# Patient Record
Sex: Female | Born: 1960 | Race: White | Hispanic: No | Marital: Married | Smoking: Never smoker
Health system: Southern US, Community
[De-identification: ages and names within clinical notes are randomized; demographics above are authoritative.]

## PROBLEM LIST (undated history)

## (undated) DIAGNOSIS — M1990 Unspecified osteoarthritis, unspecified site: Secondary | ICD-10-CM

## (undated) DIAGNOSIS — G43909 Migraine, unspecified, not intractable, without status migrainosus: Secondary | ICD-10-CM

## (undated) HISTORY — PX: COLONOSCOPY: SHX174

## (undated) HISTORY — DX: Migraine, unspecified, not intractable, without status migrainosus: G43.909

## (undated) HISTORY — DX: Unspecified osteoarthritis, unspecified site: M19.90

---

## 2001-05-09 ENCOUNTER — Encounter: Payer: Self-pay | Admitting: Specialist

## 2001-05-09 ENCOUNTER — Ambulatory Visit (HOSPITAL_COMMUNITY): Admission: RE | Admit: 2001-05-09 | Discharge: 2001-05-09 | Payer: Self-pay | Admitting: Specialist

## 2002-11-18 ENCOUNTER — Encounter: Payer: Self-pay | Admitting: Radiology

## 2002-11-18 ENCOUNTER — Encounter: Payer: Self-pay | Admitting: Neurosurgery

## 2002-11-18 ENCOUNTER — Encounter: Admission: RE | Admit: 2002-11-18 | Discharge: 2002-11-18 | Payer: Self-pay | Admitting: Neurosurgery

## 2002-12-03 ENCOUNTER — Encounter: Admission: RE | Admit: 2002-12-03 | Discharge: 2002-12-03 | Payer: Self-pay | Admitting: Neurosurgery

## 2005-04-14 ENCOUNTER — Ambulatory Visit (HOSPITAL_COMMUNITY): Admission: RE | Admit: 2005-04-14 | Discharge: 2005-04-14 | Payer: Self-pay | Admitting: Unknown Physician Specialty

## 2006-05-15 ENCOUNTER — Ambulatory Visit (HOSPITAL_COMMUNITY): Admission: RE | Admit: 2006-05-15 | Discharge: 2006-05-15 | Payer: Self-pay | Admitting: Specialist

## 2007-08-07 ENCOUNTER — Ambulatory Visit (HOSPITAL_COMMUNITY): Admission: RE | Admit: 2007-08-07 | Discharge: 2007-08-07 | Payer: Self-pay | Admitting: Obstetrics & Gynecology

## 2007-08-07 ENCOUNTER — Other Ambulatory Visit: Admission: RE | Admit: 2007-08-07 | Discharge: 2007-08-07 | Payer: Self-pay | Admitting: Obstetrics & Gynecology

## 2012-05-28 ENCOUNTER — Other Ambulatory Visit: Payer: Self-pay | Admitting: Obstetrics & Gynecology

## 2012-05-28 DIAGNOSIS — Z139 Encounter for screening, unspecified: Secondary | ICD-10-CM

## 2012-05-30 ENCOUNTER — Ambulatory Visit (HOSPITAL_COMMUNITY)
Admission: RE | Admit: 2012-05-30 | Discharge: 2012-05-30 | Disposition: A | Discharge status: Home / Self Care | Payer: BC Managed Care – PPO | Source: Ambulatory Visit | Attending: Obstetrics & Gynecology | Admitting: Obstetrics & Gynecology

## 2012-05-30 DIAGNOSIS — Z139 Encounter for screening, unspecified: Secondary | ICD-10-CM

## 2012-05-30 DIAGNOSIS — Z1231 Encounter for screening mammogram for malignant neoplasm of breast: Secondary | ICD-10-CM | POA: Insufficient documentation

## 2012-05-31 ENCOUNTER — Other Ambulatory Visit: Payer: Self-pay | Admitting: Obstetrics & Gynecology

## 2012-05-31 DIAGNOSIS — R928 Other abnormal and inconclusive findings on diagnostic imaging of breast: Secondary | ICD-10-CM

## 2012-06-07 ENCOUNTER — Telehealth: Payer: Self-pay | Admitting: Obstetrics & Gynecology

## 2012-06-07 NOTE — Telephone Encounter (Signed)
Spoke with pt. Pt was wanting to know if Dr. Despina Hidden wanted pt to have a MRI  Of right breast or diagnostic mammogram. Pt can have MRI of breast covered at 100% at [facility]. What do you advise?

## 2012-06-07 NOTE — Telephone Encounter (Signed)
Informed pt that Dr. Despina Hidden recommends a diagnostic mammogram initially then MRI if needed. Pt voiced understanding.

## 2012-06-12 ENCOUNTER — Ambulatory Visit (HOSPITAL_COMMUNITY)
Admission: RE | Admit: 2012-06-12 | Discharge: 2012-06-12 | Disposition: A | Discharge status: Home / Self Care | Payer: BC Managed Care – PPO | Source: Ambulatory Visit | Attending: Obstetrics & Gynecology | Admitting: Obstetrics & Gynecology

## 2012-06-12 DIAGNOSIS — R928 Other abnormal and inconclusive findings on diagnostic imaging of breast: Secondary | ICD-10-CM | POA: Insufficient documentation

## 2012-07-02 ENCOUNTER — Ambulatory Visit (INDEPENDENT_AMBULATORY_CARE_PROVIDER_SITE_OTHER): Payer: BC Managed Care – PPO | Admitting: Obstetrics & Gynecology

## 2012-07-02 ENCOUNTER — Encounter: Payer: Self-pay | Admitting: Obstetrics & Gynecology

## 2012-07-02 VITALS — BP 110/70 | Ht 62.0 in | Wt 126.0 lb

## 2012-07-02 DIAGNOSIS — Z01419 Encounter for gynecological examination (general) (routine) without abnormal findings: Secondary | ICD-10-CM

## 2012-07-02 NOTE — Progress Notes (Signed)
Patient ID: Kylie Miles, female   DOB: 1960-06-29, 52 y.o.   MRN: [id number] Subjective:     Kylie Miles is a 52 y.o. female here for a routine exam.  Patient's last menstrual period was 06/18/2012. No obstetric history on file. Current complaints: none.  Personal health questionnaire reviewed: no.   Gynecologic History Patient's last menstrual period was 06/18/2012. Contraception: vasectomy Last Pap: 2013  Results were: normal Last mammogram: 2014. Results were: normal  Obstetric History OB History   Grav Para Term Preterm Abortions TAB SAB Ect Mult Living                   The following portions of the patient's history were reviewed and updated as appropriate: allergies, current medications, past family history, past medical history, past social history, past surgical history and problem list.  Review of Systems  Review of Systems  Constitutional: Negative for fever, chills, weight loss, malaise/fatigue and diaphoresis.  HENT: Negative for hearing loss, ear pain, nosebleeds, congestion, sore throat, neck pain, tinnitus and ear discharge.   Eyes: Negative for blurred vision, double vision, photophobia, pain, discharge and redness.  Respiratory: Negative for cough, hemoptysis, sputum production, shortness of breath, wheezing and stridor.   Cardiovascular: Negative for chest pain, palpitations, orthopnea, claudication, leg swelling and PND.  Gastrointestinal: negative for abdominal pain. Negative for heartburn, nausea, vomiting, diarrhea, constipation, blood in stool and melena.  Genitourinary: Negative for dysuria, urgency, frequency, hematuria and flank pain.  Musculoskeletal: Negative for myalgias, back pain, joint pain and falls.  Skin: Negative for itching and rash.  Neurological: Negative for dizziness, tingling, tremors, sensory change, speech change, focal weakness, seizures, loss of consciousness, weakness and headaches.  Endo/Heme/Allergies: Negative for  environmental allergies and polydipsia. Does not bruise/bleed easily.  Psychiatric/Behavioral: Negative for depression, suicidal ideas, hallucinations, memory loss and substance abuse. The patient is not nervous/anxious and does not have insomnia.        Objective:    Physical Exam  Vitals reviewed. Constitutional: She is oriented to person, place, and time. She appears well-developed and well-nourished.  HENT:  Head: Normocephalic and atraumatic.        Right Ear: External ear normal.  Left Ear: External ear normal.  Nose: Nose normal.  Mouth/Throat: Oropharynx is clear and moist.  Eyes: Conjunctivae and EOM are normal. Pupils are equal, round, and reactive to light. Right eye exhibits no discharge. Left eye exhibits no discharge. No scleral icterus.  Neck: Normal range of motion. Neck supple. No tracheal deviation present. No thyromegaly present.  Cardiovascular: Normal rate, regular rhythm, normal heart sounds and intact distal pulses.  Exam reveals no gallop and no friction rub.   No murmur heard. Respiratory: Effort normal and breath sounds normal. No respiratory distress. She has no wheezes. She has no rales. She exhibits no tenderness.  GI: Soft. Bowel sounds are normal. She exhibits no distension and no mass. There is no tenderness. There is no rebound and no guarding.  Genitourinary:       Vulva is normal without lesions Vagina is pink moist without discharge Cervix normal in appearance and pap is done Uterus is normal size shape and contour Adnexa is negative with normal sized ovaries   Musculoskeletal: Normal range of motion. She exhibits no edema and no tenderness.  Neurological: She is alert and oriented to person, place, and time. She has normal reflexes. She displays normal reflexes. No cranial nerve deficit. She exhibits normal muscle tone. Coordination normal.  Skin: Skin  is warm and dry. No rash noted. No erythema. No pallor.  Psychiatric: She has a normal mood and  affect. Her behavior is normal. Judgment and thought content normal.       Assessment:    Healthy female exam.    Plan:    Follow up in: 1 year.

## 2013-08-04 ENCOUNTER — Other Ambulatory Visit: Payer: Self-pay | Admitting: Obstetrics & Gynecology

## 2013-08-04 DIAGNOSIS — Z1231 Encounter for screening mammogram for malignant neoplasm of breast: Secondary | ICD-10-CM

## 2013-08-11 ENCOUNTER — Ambulatory Visit (HOSPITAL_COMMUNITY)
Admission: RE | Admit: 2013-08-11 | Discharge: 2013-08-11 | Disposition: A | Discharge status: Home / Self Care | Payer: BC Managed Care – PPO | Source: Ambulatory Visit | Attending: Obstetrics & Gynecology | Admitting: Obstetrics & Gynecology

## 2013-08-11 DIAGNOSIS — Z1231 Encounter for screening mammogram for malignant neoplasm of breast: Secondary | ICD-10-CM

## 2013-08-14 ENCOUNTER — Ambulatory Visit (INDEPENDENT_AMBULATORY_CARE_PROVIDER_SITE_OTHER): Payer: BC Managed Care – PPO | Admitting: Obstetrics & Gynecology

## 2013-08-14 ENCOUNTER — Other Ambulatory Visit (HOSPITAL_COMMUNITY)
Admission: RE | Admit: 2013-08-14 | Discharge: 2013-08-14 | Disposition: A | Discharge status: Home / Self Care | Payer: BC Managed Care – PPO | Source: Ambulatory Visit | Attending: Obstetrics & Gynecology | Admitting: Obstetrics & Gynecology

## 2013-08-14 ENCOUNTER — Encounter: Payer: Self-pay | Admitting: Obstetrics & Gynecology

## 2013-08-14 VITALS — BP 120/80 | Ht 62.0 in | Wt 131.4 lb

## 2013-08-14 DIAGNOSIS — Z01419 Encounter for gynecological examination (general) (routine) without abnormal findings: Secondary | ICD-10-CM | POA: Insufficient documentation

## 2013-08-14 DIAGNOSIS — Z1151 Encounter for screening for human papillomavirus (HPV): Secondary | ICD-10-CM | POA: Insufficient documentation

## 2013-08-14 DIAGNOSIS — Z1212 Encounter for screening for malignant neoplasm of rectum: Secondary | ICD-10-CM

## 2013-08-14 NOTE — Progress Notes (Signed)
Patient ID: Kylie SongsterConnie H Wieand, female   DOB: 05/11/1960, 53 y.o.   MRN: [id number] Subjective:     Kylie Miles is a 53 y.o. female here for a routine exam. Having periods every other month. No obstetric history on file. Birth Control Method:  vasectomy Menstrual Calendar(currently): every other month 4 days  Current complaints: insomnia.   Current acute medical issues:  migraines   Recent Gynecologic History No LMP recorded. Patient is not currently having periods (Reason: Perimenopausal). Last Pap: 2014,  normal Last mammogram: 08/12/2013,  normal  Past Medical History  Diagnosis Date  . Degenerative joint disease   . Migraine     Past Surgical History  Procedure Laterality Date  . Cesarean section    . Colonoscopy      OB History   Grav Para Term Preterm Abortions TAB SAB Ect Mult Living                  History   Social History  . Marital Status: Married    Spouse Name: N/A    Number of Children: N/A  . Years of Education: N/A   Social History Main Topics  . Smoking status: Never Smoker   . Smokeless tobacco: None  . Alcohol Use: None  . Drug Use: None  . Sexual Activity: Yes   Other Topics Concern  . None   Social History Narrative  . None    Family History  Problem Relation Age of Onset  . Cancer Mother     brain tumor  . Arthritis Mother     degenative joint disease  . Breast cancer Mother   . Pancreatic cancer Father      Review of Systems  Review of Systems  Constitutional: Negative for fever, chills, weight loss, malaise/fatigue and diaphoresis.  HENT: Negative for hearing loss, ear pain, nosebleeds, congestion, sore throat, neck pain, tinnitus and ear discharge.   Eyes: Negative for blurred vision, double vision, photophobia, pain, discharge and redness.  Respiratory: Negative for cough, hemoptysis, sputum production, shortness of breath, wheezing and stridor.   Cardiovascular: Negative for chest pain, palpitations, orthopnea,  claudication, leg swelling and PND.  Gastrointestinal: negative for abdominal pain. Negative for heartburn, nausea, vomiting, diarrhea, constipation, blood in stool and melena.  Genitourinary: Negative for dysuria, urgency, frequency, hematuria and flank pain.  Musculoskeletal: Negative for myalgias, back pain, joint pain and falls.  Skin: Negative for itching and rash.  Neurological: Negative for dizziness, tingling, tremors, sensory change, speech change, focal weakness, seizures, loss of consciousness, weakness and headaches.  Endo/Heme/Allergies: Negative for environmental allergies and polydipsia. Does not bruise/bleed easily.  Psychiatric/Behavioral: Negative for depression, suicidal ideas, hallucinations, memory loss and substance abuse. The patient is not nervous/anxious and does not have insomnia.        Objective:    Physical Exam  Vitals reviewed. Constitutional: She is oriented to person, place, and time. She appears well-developed and well-nourished.  HENT:  Head: Normocephalic and atraumatic.        Right Ear: External ear normal.  Left Ear: External ear normal.  Nose: Nose normal.  Mouth/Throat: Oropharynx is clear and moist.  Eyes: Conjunctivae and EOM are normal. Pupils are equal, round, and reactive to light. Right eye exhibits no discharge. Left eye exhibits no discharge. No scleral icterus.  Neck: Normal range of motion. Neck supple. No tracheal deviation present. No thyromegaly present.  Cardiovascular: Normal rate, regular rhythm, normal heart sounds and intact distal pulses.  Exam reveals no gallop  and no friction rub.   No murmur heard. Respiratory: Effort normal and breath sounds normal. No respiratory distress. She has no wheezes. She has no rales. She exhibits no tenderness.  GI: Soft. Bowel sounds are normal. She exhibits no distension and no mass. There is no tenderness. There is no rebound and no guarding.  Genitourinary:  Breasts no masses skin changes or  nipple changes bilaterally      Vulva is normal without lesions Vagina is pink moist without discharge Cervix normal in appearance and pap is done Uterus is normal size shape and contour Adnexa is negative with normal sized ovaries  Rectal    hemoccult negative, normal tone, no masses  Musculoskeletal: Normal range of motion. She exhibits no edema and no tenderness.  Neurological: She is alert and oriented to person, place, and time. She has normal reflexes. She displays normal reflexes. No cranial nerve deficit. She exhibits normal muscle tone. Coordination normal.  Skin: Skin is warm and dry. No rash noted. No erythema. No pallor.  Psychiatric: She has a normal mood and affect. Her behavior is normal. Judgment and thought content normal.       Assessment:    Healthy female exam.    Plan:    Follow up in: 1 year. melatonin is recommended

## 2013-08-14 NOTE — Addendum Note (Signed)
Addended by: Richardson ChiquitoRAVIS, ASHLEY M on: 08/14/2013 03:17 PM   Modules accepted: Orders

## 2013-08-18 LAB — CYTOLOGY - PAP

## 2014-07-03 ENCOUNTER — Other Ambulatory Visit: Payer: Self-pay | Admitting: Obstetrics & Gynecology

## 2014-07-03 DIAGNOSIS — Z1231 Encounter for screening mammogram for malignant neoplasm of breast: Secondary | ICD-10-CM

## 2014-08-17 ENCOUNTER — Ambulatory Visit (HOSPITAL_COMMUNITY): Payer: Self-pay

## 2014-08-24 ENCOUNTER — Other Ambulatory Visit: Payer: Self-pay | Admitting: Obstetrics & Gynecology

## 2014-08-24 ENCOUNTER — Encounter: Payer: Self-pay | Admitting: Obstetrics & Gynecology

## 2014-08-24 ENCOUNTER — Ambulatory Visit (INDEPENDENT_AMBULATORY_CARE_PROVIDER_SITE_OTHER): Payer: BLUE CROSS/BLUE SHIELD | Admitting: Obstetrics & Gynecology

## 2014-08-24 ENCOUNTER — Ambulatory Visit (HOSPITAL_COMMUNITY)
Admission: RE | Admit: 2014-08-24 | Discharge: 2014-08-24 | Disposition: A | Discharge status: Home / Self Care | Payer: BLUE CROSS/BLUE SHIELD | Source: Ambulatory Visit | Attending: Obstetrics & Gynecology | Admitting: Obstetrics & Gynecology

## 2014-08-24 ENCOUNTER — Other Ambulatory Visit (HOSPITAL_COMMUNITY)
Admission: RE | Admit: 2014-08-24 | Discharge: 2014-08-24 | Disposition: A | Discharge status: Home / Self Care | Payer: BLUE CROSS/BLUE SHIELD | Source: Ambulatory Visit | Attending: Obstetrics & Gynecology | Admitting: Obstetrics & Gynecology

## 2014-08-24 VITALS — BP 120/90 | HR 68 | Ht 62.0 in | Wt 124.0 lb

## 2014-08-24 DIAGNOSIS — Z01419 Encounter for gynecological examination (general) (routine) without abnormal findings: Secondary | ICD-10-CM | POA: Diagnosis present

## 2014-08-24 DIAGNOSIS — Z1231 Encounter for screening mammogram for malignant neoplasm of breast: Secondary | ICD-10-CM | POA: Diagnosis not present

## 2014-08-24 NOTE — Addendum Note (Signed)
Addended by: Criss AlvinePULLIAM, Keywon Mestre G on: 08/24/2014 01:46 PM   Modules accepted: Orders

## 2014-08-24 NOTE — Progress Notes (Signed)
Patient ID: Kylie Miles, female   DOB: 1960/06/19, 54 y.o.   MRN: [id number] Subjective:     Kylie Miles is a 54 y.o. female here for a routine exam.  Patient's last menstrual period was 08/17/2014. No obstetric history on file. Birth Control Method:  Husband has vasectomy  Menstrual Calendar(currently): Patient's last menstrual period was 08/17/2014.  Current complaints: Having menses q6 months currently. Endorsing migraine headache with menses.  Well controlled with Treximet.   Current acute medical issues:  none   Recent Gynecologic History Patient's last menstrual period was 08/17/2014. Last Pap: 08/14/2013,  normal Last mammogram: 08/2013,  Normal.  Annual mammogram performed today.  Past Medical History  Diagnosis Date  . Degenerative joint disease   . Migraine     Past Surgical History  Procedure Laterality Date  . Cesarean section    . Colonoscopy      OB History    No data available      History   Social History  . Marital Status: Married    Spouse Name: N/A  . Number of Children: N/A  . Years of Education: N/A   Social History Main Topics  . Smoking status: Never Smoker   . Smokeless tobacco: Not on file  . Alcohol Use: Not on file  . Drug Use: Not on file  . Sexual Activity: Yes   Other Topics Concern  . None   Social History Narrative    Family History  Problem Relation Age of Onset  . Cancer Mother     brain tumor  . Arthritis Mother     degenative joint disease  . Breast cancer Mother   . Pancreatic cancer Father      Current outpatient prescriptions:  .  FLUoxetine (PROZAC) 20 MG capsule, Take 20 mg by mouth daily., Disp: , Rfl:  .  nabumetone (RELAFEN) 500 MG tablet, Take 500 mg by mouth 2 (two) times daily., Disp: , Rfl:  .  SUMAtriptan-naproxen (TREXIMET) 85-500 MG per tablet, Take 1 tablet by mouth every 2 (two) hours as needed for migraine., Disp: , Rfl:  .  topiramate (TOPAMAX) 100 MG tablet, Take 200 mg by mouth at  bedtime., Disp: , Rfl:  .  cyclobenzaprine (FLEXERIL) 5 MG tablet, Take 5 mg by mouth at bedtime., Disp: , Rfl:   Review of Systems  Review of Systems  Constitutional: Negative for fever, chills, weight loss, malaise/fatigue and diaphoresis.  HENT: Negative for hearing loss, ear pain, nosebleeds, congestion, sore throat, neck pain, tinnitus and ear discharge.   Eyes: Negative for blurred vision, double vision, photophobia, pain, discharge and redness.  Respiratory: Negative for cough, hemoptysis, sputum production, shortness of breath, wheezing and stridor.   Cardiovascular: Negative for chest pain, palpitations, orthopnea, claudication, leg swelling and PND.  Gastrointestinal: negative for abdominal pain. Negative for heartburn, nausea, vomiting, diarrhea, constipation, blood in stool and melena.  Genitourinary: Negative for dysuria, urgency, frequency, hematuria and flank pain.  Musculoskeletal: Negative for myalgias, back pain, joint pain and falls.  Skin: Negative for itching and rash.  Neurological: Negative for dizziness, tingling, tremors, sensory change, speech change, focal weakness, seizures, loss of consciousness, weakness and headaches.  Endo/Heme/Allergies: Negative for environmental allergies and polydipsia. Does not bruise/bleed easily.  Psychiatric/Behavioral: Negative for depression, suicidal ideas, hallucinations, memory loss and substance abuse. The patient is not nervous/anxious and does not have insomnia.        Objective:  Blood pressure 120/90, pulse 68, height  (1.575 m), weight  124 lb (56.246 kg), last menstrual period 08/17/2014.   Physical Exam  Vitals reviewed. Constitutional: She is oriented to person, place, and time. She appears well-developed and well-nourished.  HENT:  Head: Normocephalic and atraumatic.        Right Ear: External ear normal.  Left Ear: External ear normal.  Nose: Nose normal.  Mouth/Throat: Oropharynx is clear and moist.  Eyes:  Conjunctivae and EOM are normal. Pupils are equal, round, and reactive to light. Right eye exhibits no discharge. Left eye exhibits no discharge. No scleral icterus.  Neck: Normal range of motion. Neck supple. No tracheal deviation present. No thyromegaly present.  Cardiovascular: Normal rate, regular rhythm, normal heart sounds and intact distal pulses.  Exam reveals no gallop and no friction rub.   No murmur heard. Respiratory: Effort normal and breath sounds normal. No respiratory distress. She has no wheezes. She has no rales. She exhibits no tenderness.  GI: Soft. Bowel sounds are normal. She exhibits no distension and no mass. There is no tenderness. There is no rebound and no guarding.  Genitourinary:  Breasts no masses, skin changes or nipple changes bilaterally.       Vulva is normal without lesions Vagina is pink moist without discharge Cervix normal in appearance and pap is done Uterus is normal size shape and contour Adnexa is negative with normal sized ovaries  Rectal deferred.  Last Colonoscopy 11/2013, no malignancy. Musculoskeletal: Normal range of motion. She exhibits no edema and no tenderness.  Neurological: She is alert and oriented to person, place, and time. She has normal reflexes. She displays normal reflexes. No cranial nerve deficit. She exhibits normal muscle tone. Coordination normal.  Skin: Skin is warm and dry. No rash noted. No erythema. No pallor.  Psychiatric: She has a normal mood and affect. Her behavior is normal. Judgment and thought content normal.       Assessment:    Healthy female exam.    Plan:    Follow up in: 1 year.    Follow up today's Mammogram. Pap obtained today.  Will contact patient with results

## 2014-08-26 LAB — CYTOLOGY - PAP

## 2017-12-21 ENCOUNTER — Other Ambulatory Visit: Payer: Self-pay | Admitting: Obstetrics & Gynecology

## 2017-12-21 DIAGNOSIS — Z1231 Encounter for screening mammogram for malignant neoplasm of breast: Secondary | ICD-10-CM

## 2018-02-08 ENCOUNTER — Ambulatory Visit (INDEPENDENT_AMBULATORY_CARE_PROVIDER_SITE_OTHER): Payer: BLUE CROSS/BLUE SHIELD | Admitting: Obstetrics & Gynecology

## 2018-02-08 ENCOUNTER — Encounter (HOSPITAL_COMMUNITY): Payer: Self-pay

## 2018-02-08 ENCOUNTER — Encounter: Payer: Self-pay | Admitting: Obstetrics & Gynecology

## 2018-02-08 ENCOUNTER — Other Ambulatory Visit (HOSPITAL_COMMUNITY)
Admission: RE | Admit: 2018-02-08 | Discharge: 2018-02-08 | Disposition: A | Discharge status: Home / Self Care | Payer: BLUE CROSS/BLUE SHIELD | Source: Ambulatory Visit | Attending: Obstetrics & Gynecology | Admitting: Obstetrics & Gynecology

## 2018-02-08 ENCOUNTER — Ambulatory Visit (HOSPITAL_COMMUNITY)
Admission: RE | Admit: 2018-02-08 | Discharge: 2018-02-08 | Disposition: A | Discharge status: Home / Self Care | Payer: BLUE CROSS/BLUE SHIELD | Source: Ambulatory Visit | Attending: Obstetrics & Gynecology | Admitting: Obstetrics & Gynecology

## 2018-02-08 VITALS — BP 119/69 | HR 67 | Ht 62.0 in | Wt 134.0 lb

## 2018-02-08 DIAGNOSIS — Z01419 Encounter for gynecological examination (general) (routine) without abnormal findings: Secondary | ICD-10-CM

## 2018-02-08 DIAGNOSIS — Z1212 Encounter for screening for malignant neoplasm of rectum: Secondary | ICD-10-CM

## 2018-02-08 DIAGNOSIS — Z1231 Encounter for screening mammogram for malignant neoplasm of breast: Secondary | ICD-10-CM | POA: Insufficient documentation

## 2018-02-08 DIAGNOSIS — Z1211 Encounter for screening for malignant neoplasm of colon: Secondary | ICD-10-CM | POA: Diagnosis not present

## 2018-02-08 LAB — HEMOCCULT GUIAC POC 1CARD (OFFICE): Fecal Occult Blood, POC: NEGATIVE

## 2018-02-08 NOTE — Addendum Note (Signed)
Addended by: Tish Frederickson A on: 02/08/2018 12:14 PM   Modules accepted: Orders

## 2018-02-08 NOTE — Progress Notes (Signed)
Subjective:     Kylie Miles is a 58 y.o. female here for a routine exam.  Patient's last menstrual period was 08/17/2014. No obstetric history on file. Birth Control Method:  menopause Menstrual Calendar(currently): amenorrheic for 3 years  Current complaints: none.   Current acute medical issues:  none   Recent Gynecologic History Patient's last menstrual period was 08/17/2014. Last Pap: 2016,  normal Last mammogram: today ,  normal  Past Medical History:  Diagnosis Date  . Degenerative joint disease   . Migraine     Past Surgical History:  Procedure Laterality Date  . CESAREAN SECTION    . COLONOSCOPY      OB History   No obstetric history on file.     Social History   Socioeconomic History  . Marital status: Married    Spouse name: Not on file  . Number of children: 1  . Years of education: Not on file  . Highest education level: Not on file  Occupational History  . Not on file  Social Needs  . Financial resource strain: Not on file  . Food insecurity:    Worry: Not on file    Inability: Not on file  . Transportation needs:    Medical: Not on file    Non-medical: Not on file  Tobacco Use  . Smoking status: Never Smoker  . Smokeless tobacco: Never Used  Substance and Sexual Activity  . Alcohol use: Never    Frequency: Never  . Drug use: Never  . Sexual activity: Yes  Lifestyle  . Physical activity:    Days per week: Not on file    Minutes per session: Not on file  . Stress: Not on file  Relationships  . Social connections:    Talks on phone: Not on file    Gets together: Not on file    Attends religious service: Not on file    Active member of club or organization: Not on file    Attends meetings of clubs or organizations: Not on file    Relationship status: Not on file  Other Topics Concern  . Not on file  Social History Narrative  . Not on file    Family History  Problem Relation Age of Onset  . Cancer Mother        brain tumor   . Arthritis Mother        degenative joint disease  . Breast cancer Mother   . Pancreatic cancer Father      Current Outpatient Medications:  .  cyclobenzaprine (FLEXERIL) 5 MG tablet, Take 5 mg by mouth at bedtime., Disp: , Rfl:  .  FLUoxetine (PROZAC) 20 MG capsule, Take 20 mg by mouth daily., Disp: , Rfl:  .  meloxicam (MOBIC) 15 MG tablet, Take 15 mg by mouth daily., Disp: , Rfl:  .  rosuvastatin (CRESTOR) 10 MG tablet, Take 10 mg by mouth daily., Disp: , Rfl:  .  SUMAtriptan-naproxen (TREXIMET) 85-500 MG per tablet, Take 1 tablet by mouth every 2 (two) hours as needed for migraine., Disp: , Rfl:  .  topiramate (TOPAMAX) 100 MG tablet, Take 200 mg by mouth at bedtime., Disp: , Rfl:  .  nabumetone (RELAFEN) 500 MG tablet, Take 500 mg by mouth 2 (two) times daily., Disp: , Rfl:   Review of Systems  Review of Systems  Constitutional: Negative for fever, chills, weight loss, malaise/fatigue and diaphoresis.  HENT: Negative for hearing loss, ear pain, nosebleeds, congestion, sore throat, neck  pain, tinnitus and ear discharge.   Eyes: Negative for blurred vision, double vision, photophobia, pain, discharge and redness.  Respiratory: Negative for cough, hemoptysis, sputum production, shortness of breath, wheezing and stridor.   Cardiovascular: Negative for chest pain, palpitations, orthopnea, claudication, leg swelling and PND.  Gastrointestinal: negative for abdominal pain. Negative for heartburn, nausea, vomiting, diarrhea, constipation, blood in stool and melena.  Genitourinary: Negative for dysuria, urgency, frequency, hematuria and flank pain.  Musculoskeletal: Negative for myalgias, back pain, joint pain and falls.  Skin: Negative for itching and rash.  Neurological: Negative for dizziness, tingling, tremors, sensory change, speech change, focal weakness, seizures, loss of consciousness, weakness and headaches.  Endo/Heme/Allergies: Negative for environmental allergies and  polydipsia. Does not bruise/bleed easily.  Psychiatric/Behavioral: Negative for depression, suicidal ideas, hallucinations, memory loss and substance abuse. The patient is not nervous/anxious and does not have insomnia.        Objective:  Blood pressure 119/69, pulse 67, height 5\' 2"  (1.575 m), weight 134 lb (60.8 kg), last menstrual period 08/17/2014.   Physical Exam  Vitals reviewed. Constitutional: She is oriented to person, place, and time. She appears well-developed and well-nourished.  HENT:  Head: Normocephalic and atraumatic.        Right Ear: External ear normal.  Left Ear: External ear normal.  Nose: Nose normal.  Mouth/Throat: Oropharynx is clear and moist.  Eyes: Conjunctivae and EOM are normal. Pupils are equal, round, and reactive to light. Right eye exhibits no discharge. Left eye exhibits no discharge. No scleral icterus.  Neck: Normal range of motion. Neck supple. No tracheal deviation present. No thyromegaly present.  Cardiovascular: Normal rate, regular rhythm, normal heart sounds and intact distal pulses.  Exam reveals no gallop and no friction rub.   No murmur heard. Respiratory: Effort normal and breath sounds normal. No respiratory distress. She has no wheezes. She has no rales. She exhibits no tenderness.  GI: Soft. Bowel sounds are normal. She exhibits no distension and no mass. There is no tenderness. There is no rebound and no guarding.  Genitourinary:  Breasts no masses skin changes or nipple changes bilaterally      Vulva is normal without lesions Vagina is pink moist without discharge Cervix normal in appearance and pap is done Uterus is normal size shape and contour Adnexa is negative with normal sized ovaries  {Rectal    hemoccult negative, normal tone, no masses  Musculoskeletal: Normal range of motion. She exhibits no edema and no tenderness.  Neurological: She is alert and oriented to person, place, and time. She has normal reflexes. She displays  normal reflexes. No cranial nerve deficit. She exhibits normal muscle tone. Coordination normal.  Skin: Skin is warm and dry. No rash noted. No erythema. No pallor.  Psychiatric: She has a normal mood and affect. Her behavior is normal. Judgment and thought content normal.       Medications Ordered at today's visit: No orders of the defined types were placed in this encounter.   Other orders placed at today's visit: No orders of the defined types were placed in this encounter.     Assessment:    Healthy female exam.    Plan:    Contraception: post menopausal status. Mammogram ordered. Follow up in: 2 years.     Return in about 3 years (around 02/08/2021) for yearly.

## 2018-02-12 LAB — CYTOLOGY - PAP
Diagnosis: NEGATIVE
HPV: NOT DETECTED

## 2020-11-29 ENCOUNTER — Other Ambulatory Visit (HOSPITAL_COMMUNITY): Payer: Self-pay | Admitting: General Practice

## 2020-11-29 DIAGNOSIS — Z1231 Encounter for screening mammogram for malignant neoplasm of breast: Secondary | ICD-10-CM

## 2020-12-06 ENCOUNTER — Ambulatory Visit (HOSPITAL_COMMUNITY)
Admission: RE | Admit: 2020-12-06 | Discharge: 2020-12-06 | Disposition: A | Discharge status: Home / Self Care | Payer: BC Managed Care – PPO | Source: Ambulatory Visit | Attending: General Practice | Admitting: General Practice

## 2020-12-06 ENCOUNTER — Other Ambulatory Visit: Payer: Self-pay

## 2020-12-06 DIAGNOSIS — Z1231 Encounter for screening mammogram for malignant neoplasm of breast: Secondary | ICD-10-CM | POA: Diagnosis present

## 2022-06-19 ENCOUNTER — Other Ambulatory Visit (HOSPITAL_COMMUNITY): Payer: Self-pay | Admitting: General Practice

## 2022-06-19 DIAGNOSIS — Z1231 Encounter for screening mammogram for malignant neoplasm of breast: Secondary | ICD-10-CM

## 2022-06-20 ENCOUNTER — Encounter: Payer: Self-pay | Admitting: Obstetrics & Gynecology

## 2022-06-20 ENCOUNTER — Ambulatory Visit (INDEPENDENT_AMBULATORY_CARE_PROVIDER_SITE_OTHER): Payer: BC Managed Care – PPO | Admitting: Obstetrics & Gynecology

## 2022-06-20 ENCOUNTER — Other Ambulatory Visit (HOSPITAL_COMMUNITY)
Admission: RE | Admit: 2022-06-20 | Discharge: 2022-06-20 | Disposition: A | Discharge status: Home / Self Care | Payer: BC Managed Care – PPO | Source: Ambulatory Visit | Attending: Obstetrics & Gynecology | Admitting: Obstetrics & Gynecology

## 2022-06-20 VITALS — BP 120/67 | HR 71 | Ht 62.0 in | Wt 126.0 lb

## 2022-06-20 DIAGNOSIS — Z01419 Encounter for gynecological examination (general) (routine) without abnormal findings: Secondary | ICD-10-CM

## 2022-06-20 DIAGNOSIS — Z1212 Encounter for screening for malignant neoplasm of rectum: Secondary | ICD-10-CM | POA: Diagnosis not present

## 2022-06-20 DIAGNOSIS — Z1231 Encounter for screening mammogram for malignant neoplasm of breast: Secondary | ICD-10-CM

## 2022-06-20 DIAGNOSIS — Z1211 Encounter for screening for malignant neoplasm of colon: Secondary | ICD-10-CM

## 2022-06-20 DIAGNOSIS — R6882 Decreased libido: Secondary | ICD-10-CM | POA: Diagnosis not present

## 2022-06-20 LAB — HEMOCCULT GUIAC POC 1CARD (OFFICE): Fecal Occult Blood, POC: NEGATIVE

## 2022-06-20 MED ORDER — CONJ ESTROGENS-BAZEDOXIFENE 0.45-20 MG PO TABS: 1.0000 | ORAL_TABLET | Freq: Every day | ORAL | 11 refills | Status: AC

## 2022-06-20 NOTE — Progress Notes (Signed)
Subjective:     Kylie Miles is a 62 y.o. female here for a routine exam.  Patient's last menstrual period was 08/17/2014. G1P1001 Birth Control Method:  menopausal Menstrual Calendar(currently): amenorrhea  Current complaints: decreased libido.   Current acute medical issues:  none   Recent Gynecologic History Patient's last menstrual period was 08/17/2014. Last Pap: 2020,  normal Last mammogram: 2022,  normal  Past Medical History:  Diagnosis Date   Degenerative joint disease    Migraine     Past Surgical History:  Procedure Laterality Date   CESAREAN SECTION     COLONOSCOPY      OB History     Gravida  1   Para  1   Term  1   Preterm      AB      Living  1      SAB      IAB      Ectopic      Multiple      Live Births              Social History   Socioeconomic History   Marital status: Married    Spouse name: Not on file   Number of children: 1   Years of education: Not on file   Highest education level: Not on file  Occupational History   Not on file  Tobacco Use   Smoking status: Never   Smokeless tobacco: Never  Vaping Use   Vaping Use: Never used  Substance and Sexual Activity   Alcohol use: Never   Drug use: Never   Sexual activity: Yes    Birth control/protection: Post-menopausal  Other Topics Concern   Not on file  Social History Narrative   Not on file   Social Determinants of Health   Financial Resource Strain: Low Risk  (06/20/2022)   Overall Financial Resource Strain (CARDIA)    Difficulty of Paying Living Expenses: Not very hard  Food Insecurity: No Food Insecurity (06/20/2022)   Hunger Vital Sign    Worried About Running Out of Food in the Last Year: Never true    Ran Out of Food in the Last Year: Never true  Transportation Needs: No Transportation Needs (06/20/2022)   PRAPARE - Administrator, Civil Service (Medical): No    Lack of Transportation (Non-Medical): No  Physical Activity:  Sufficiently Active (06/20/2022)   Exercise Vital Sign    Days of Exercise per Week: 2 days    Minutes of Exercise per Session: 100 min  Stress: No Stress Concern Present (06/20/2022)   [employer] of Occupational Health - Occupational Stress Questionnaire    Feeling of Stress : Not at all  Social Connections: Moderately Integrated (06/20/2022)   Social Connection and Isolation Panel [NHANES]    Frequency of Communication with Friends and Family: More than three times a week    Frequency of Social Gatherings with Friends and Family: Twice a week    Attends Religious Services: Never    Database administrator or Organizations: Yes    Attends Engineer, structural: More than 4 times per year    Marital Status: Married    Family History  Problem Relation Age of Onset   Cancer Mother        brain tumor   Arthritis Mother        degenative joint disease   Breast cancer Mother    Pancreatic cancer Father  Current Outpatient Medications:    alendronate (FOSAMAX) 70 MG tablet, Take 70 mg by mouth once a week. Take with a full glass of water on an empty stomach., Disp: , Rfl:    Conj Estrogens-Bazedoxifene 0.45-20 MG TABS, Take 1 tablet by mouth daily. Take 1 tablet daily, Disp: 30 tablet, Rfl: 11   cyclobenzaprine (FLEXERIL) 5 MG tablet, Take 5 mg by mouth at bedtime., Disp: , Rfl:    FLUoxetine (PROZAC) 20 MG capsule, Take 30 mg by mouth daily., Disp: , Rfl:    meloxicam (MOBIC) 15 MG tablet, Take 15 mg by mouth daily., Disp: , Rfl:    rosuvastatin (CRESTOR) 10 MG tablet, Take 10 mg by mouth daily., Disp: , Rfl:    topiramate (TOPAMAX) 100 MG tablet, Take 200 mg by mouth at bedtime., Disp: , Rfl:   Review of Systems  Review of Systems  Constitutional: Negative for fever, chills, weight loss, malaise/fatigue and diaphoresis.  HENT: Negative for hearing loss, ear pain, nosebleeds, congestion, sore throat, neck pain, tinnitus and ear discharge.   Eyes: Negative for  blurred vision, double vision, photophobia, pain, discharge and redness.  Respiratory: Negative for cough, hemoptysis, sputum production, shortness of breath, wheezing and stridor.   Cardiovascular: Negative for chest pain, palpitations, orthopnea, claudication, leg swelling and PND.  Gastrointestinal: negative for abdominal pain. Negative for heartburn, nausea, vomiting, diarrhea, constipation, blood in stool and melena.  Genitourinary: Negative for dysuria, urgency, frequency, hematuria and flank pain.  Musculoskeletal: Negative for myalgias, back pain, joint pain and falls.  Skin: Negative for itching and rash.  Neurological: Negative for dizziness, tingling, tremors, sensory change, speech change, focal weakness, seizures, loss of consciousness, weakness and headaches.  Endo/Heme/Allergies: Negative for environmental allergies and polydipsia. Does not bruise/bleed easily.  Psychiatric/Behavioral: Negative for depression, suicidal ideas, hallucinations, memory loss and substance abuse. The patient is not nervous/anxious and does not have insomnia.        Objective:  Blood pressure 120/67, pulse 71, height 5\' 2"  (1.575 m), weight 126 lb (57.2 kg), last menstrual period 08/17/2014.   Physical Exam  Vitals reviewed. Constitutional: She is oriented to person, place, and time. She appears well-developed and well-nourished.  HENT:  Head: Normocephalic and atraumatic.        Right Ear: External ear normal.  Left Ear: External ear normal.  Nose: Nose normal.  Mouth/Throat: Oropharynx is clear and moist.  Eyes: Conjunctivae and EOM are normal. Pupils are equal, round, and reactive to light. Right eye exhibits no discharge. Left eye exhibits no discharge. No scleral icterus.  Neck: Normal range of motion. Neck supple. No tracheal deviation present. No thyromegaly present.  Cardiovascular: Normal rate, regular rhythm, normal heart sounds and intact distal pulses.  Exam reveals no gallop and no  friction rub.   No murmur heard. Respiratory: Effort normal and breath sounds normal. No respiratory distress. She has no wheezes. She has no rales. She exhibits no tenderness.  GI: Soft. Bowel sounds are normal. She exhibits no distension and no mass. There is no tenderness. There is no rebound and no guarding.  Genitourinary:  Breasts no masses skin changes or nipple changes bilaterally      Vulva is normal without lesions Vagina is pink moist without discharge Cervix normal in appearance and pap is done Uterus is normal size shape and contour Adnexa is negative with normal sized ovaries  {Rectal    hemoccult negative, normal tone, no masses  Musculoskeletal: Normal range of motion. She exhibits no edema and  no tenderness.  Neurological: She is alert and oriented to person, place, and time. She has normal reflexes. She displays normal reflexes. No cranial nerve deficit. She exhibits normal muscle tone. Coordination normal.  Skin: Skin is warm and dry. No rash noted. No erythema. No pallor.  Psychiatric: She has a normal mood and affect. Her behavior is normal. Judgment and thought content normal.       Medications Ordered at today's visit: Meds ordered this encounter  Medications   Conj Estrogens-Bazedoxifene 0.45-20 MG TABS    Sig: Take 1 tablet by mouth daily. Take 1 tablet daily    Dispense:  30 tablet    Refill:  11    Other orders placed at today's visit: Orders Placed This Encounter  Procedures   MM 3D SCREENING MAMMOGRAM BILATERAL BREAST      Assessment:    Normal Gyn exam.   Diminished libido-->no dyspareunia Plan:    Hormone replacement therapy: hormone replacement therapy: will trial duavee. Mammogram ordered. Follow up in: 1 years.     Return in about 1 year (around 06/20/2023) for check on response to duavee.

## 2022-06-20 NOTE — Addendum Note (Signed)
Addended by: Annamarie Dawley on: 06/20/2022 10:09 AM   Modules accepted: Orders

## 2022-06-21 LAB — CYTOLOGY - PAP
Comment: NEGATIVE
Diagnosis: NEGATIVE
High risk HPV: NEGATIVE

## 2022-06-26 ENCOUNTER — Ambulatory Visit (HOSPITAL_COMMUNITY): Payer: BC Managed Care – PPO

## 2022-06-28 ENCOUNTER — Ambulatory Visit (HOSPITAL_COMMUNITY)
Admission: RE | Admit: 2022-06-28 | Discharge: 2022-06-28 | Disposition: A | Discharge status: Home / Self Care | Payer: BC Managed Care – PPO | Source: Ambulatory Visit | Attending: General Practice | Admitting: General Practice

## 2022-06-28 DIAGNOSIS — Z1231 Encounter for screening mammogram for malignant neoplasm of breast: Secondary | ICD-10-CM | POA: Diagnosis present

## 2022-12-21 IMAGING — MG MM DIGITAL SCREENING BILAT W/ TOMO AND CAD
8 series · 9 of 24 positions shown · non-contrast
Comparison: Previous exam(s).

CLINICAL DATA: Screening.

EXAM:
DIGITAL SCREENING BILATERAL MAMMOGRAM WITH TOMOSYNTHESIS AND CAD
TECHNIQUE: Bilateral screening digital craniocaudal and mediolateral oblique
mammograms were obtained. Bilateral screening digital breast
tomosynthesis was performed. The images were evaluated with
computer-aided detection.

[L MLO synth-2D]
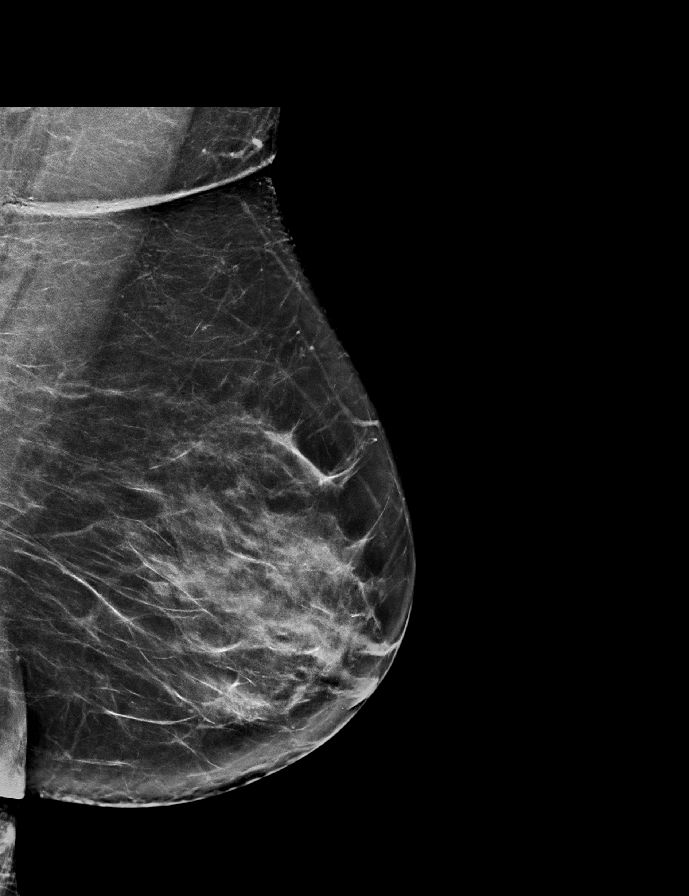

[R CC synth-2D]
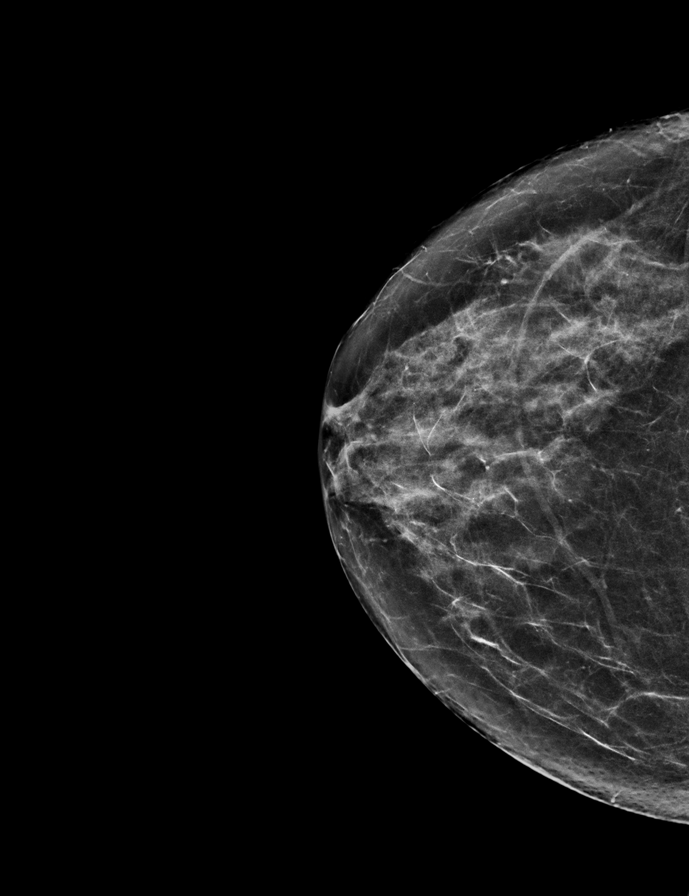

[R MLO synth-2D]
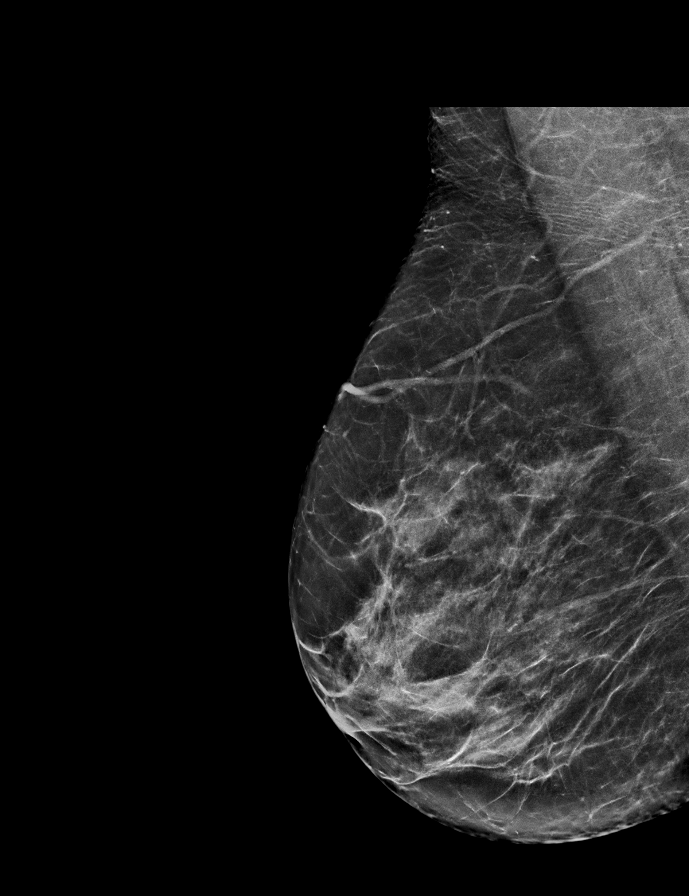

[L CC synth-2D]
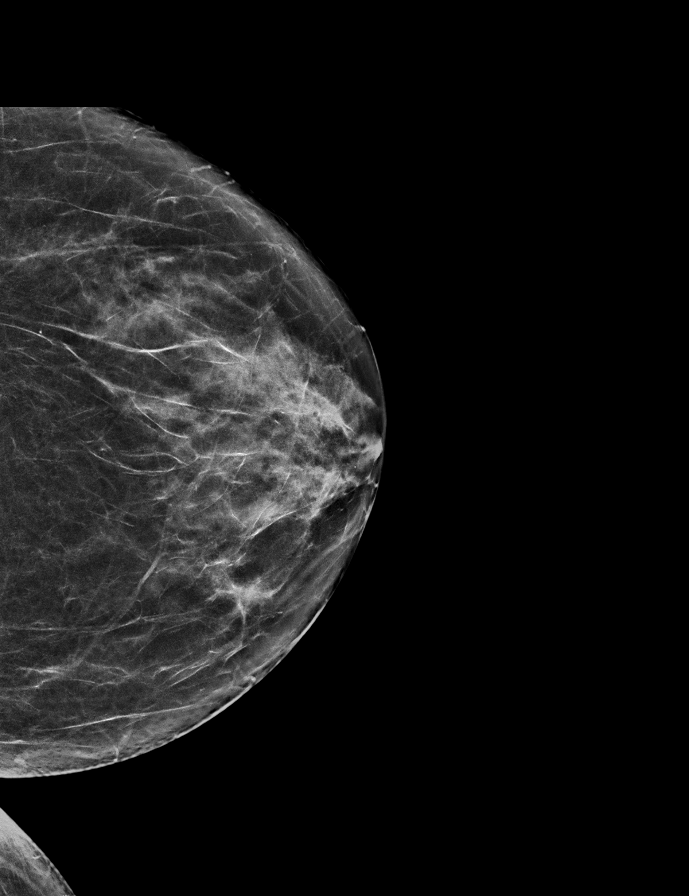

[R MLO tomo · 2 of 64 frames shown]
[frame 21/64]
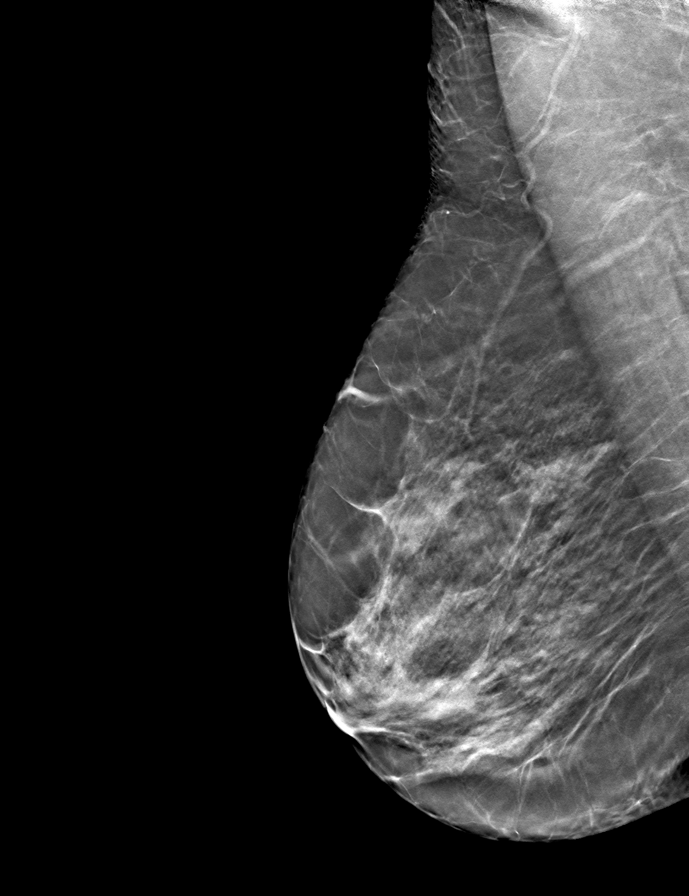
[frame 33/64]
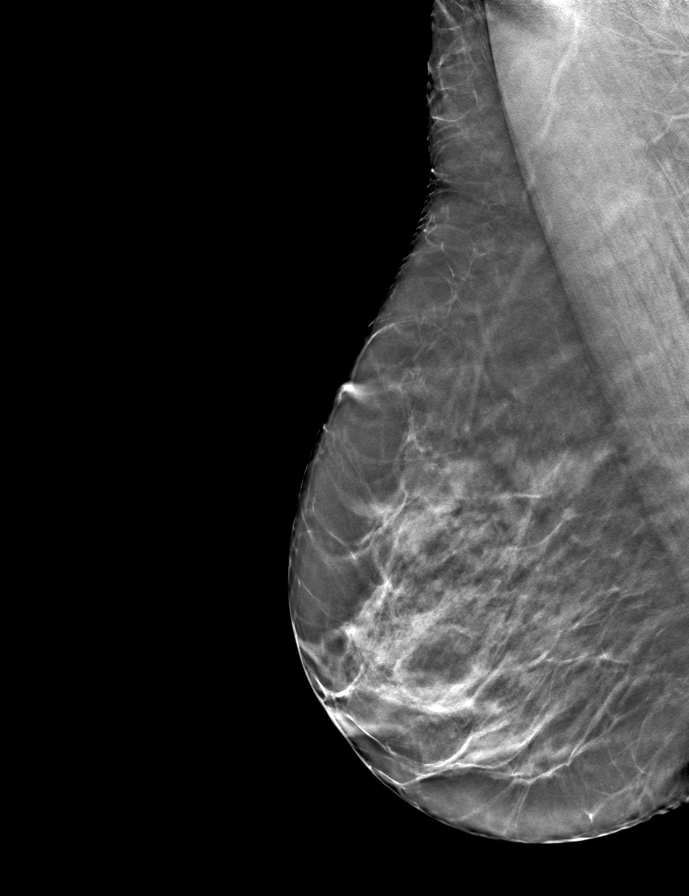

[R CC tomo · tomo slice 31/60.0]
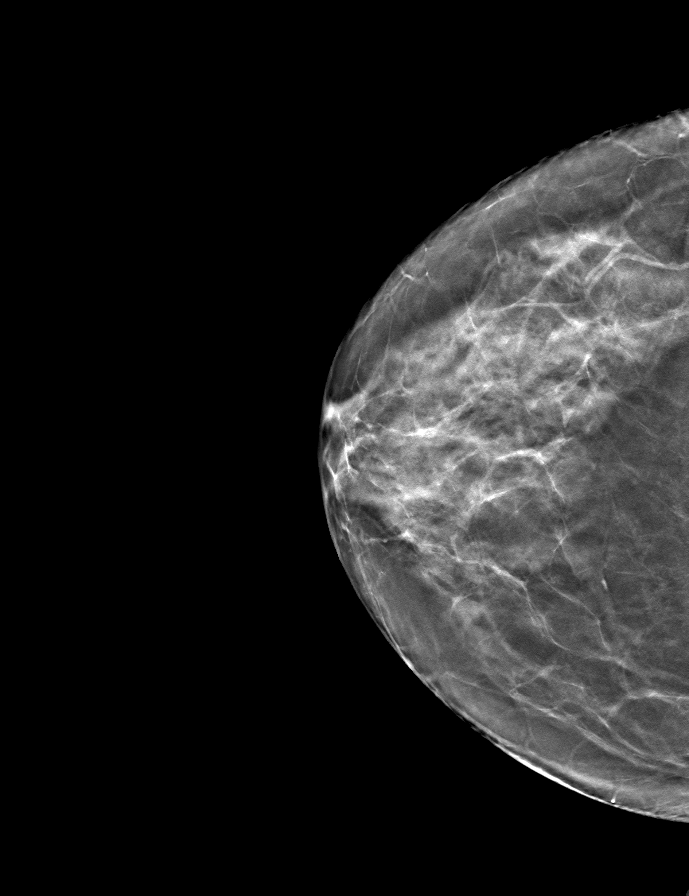

[L CC tomo · tomo slice 31/61.0]
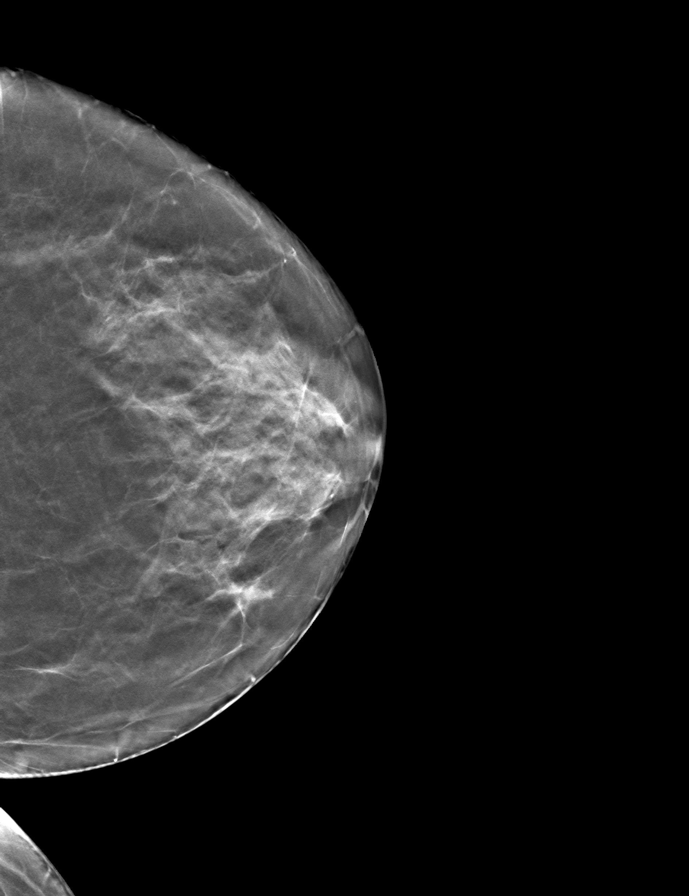

[L MLO tomo · tomo slice 35/70.0]
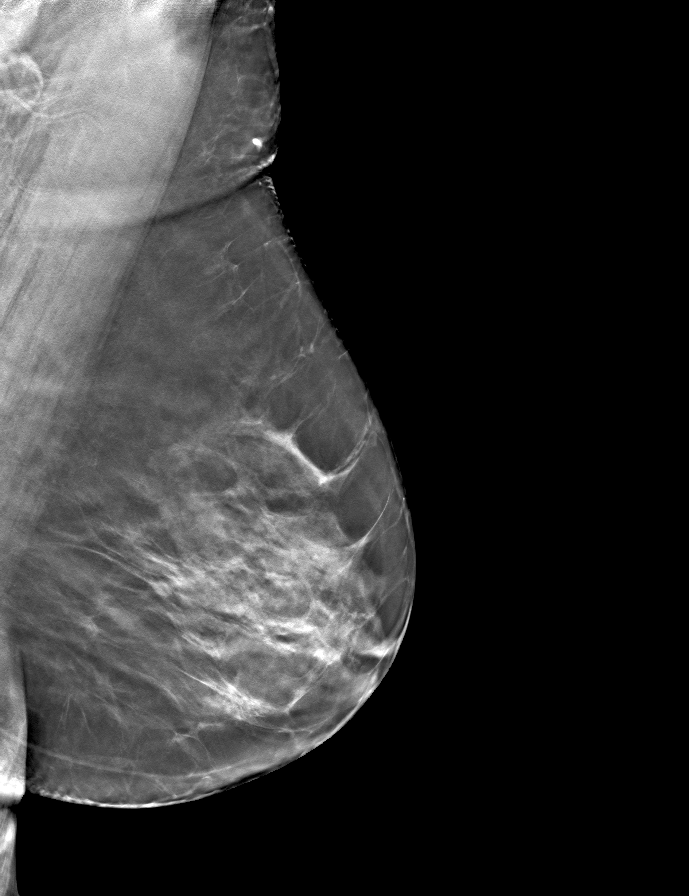

[9 of 24 positions shown; findings below may reference images not displayed]

ACR Breast Density Category c: The breast tissue is heterogeneously
dense, which may obscure small masses.
FINDINGS: There are no findings suspicious for malignancy.
IMPRESSION: No mammographic evidence of malignancy. A result letter of this
screening mammogram will be mailed directly to the patient.

RECOMMENDATION:
Screening mammogram in one year. (Code:Q3-W-BC3)

BI-RADS CATEGORY  1: Negative.

## 2023-06-08 ENCOUNTER — Other Ambulatory Visit (HOSPITAL_COMMUNITY): Payer: Self-pay | Admitting: General Practice

## 2023-06-08 DIAGNOSIS — Z1231 Encounter for screening mammogram for malignant neoplasm of breast: Secondary | ICD-10-CM

## 2023-07-06 ENCOUNTER — Ambulatory Visit: Admitting: Obstetrics & Gynecology

## 2023-07-06 ENCOUNTER — Ambulatory Visit (HOSPITAL_COMMUNITY)
Admission: RE | Admit: 2023-07-06 | Discharge: 2023-07-06 | Disposition: A | Discharge status: Home / Self Care | Source: Ambulatory Visit | Attending: General Practice | Admitting: General Practice

## 2023-07-06 ENCOUNTER — Encounter: Payer: Self-pay | Admitting: Obstetrics & Gynecology

## 2023-07-06 VITALS — BP 133/80 | HR 76 | Ht 62.0 in | Wt 128.0 lb

## 2023-07-06 DIAGNOSIS — Z01419 Encounter for gynecological examination (general) (routine) without abnormal findings: Secondary | ICD-10-CM | POA: Diagnosis not present

## 2023-07-06 DIAGNOSIS — Z1231 Encounter for screening mammogram for malignant neoplasm of breast: Secondary | ICD-10-CM | POA: Diagnosis present

## 2023-07-06 NOTE — Progress Notes (Signed)
 Subjective:     Kylie Miles is a 63 y.o. female here for a routine exam.  Patient's last menstrual period was 08/17/2014. G1P1001 Birth Control Method:  menopausal Menstrual Calendar(currently): amenorrhea  Current complaints: none.   Current acute medical issues:  none   Recent Gynecologic History Patient's last menstrual period was 08/17/2014. Last Pap: 06/2022,  normal Last mammogram: 06/2022,  normal (having today)  Past Medical History:  Diagnosis Date   Degenerative joint disease    Migraine     Past Surgical History:  Procedure Laterality Date   CESAREAN SECTION     COLONOSCOPY      OB History     Gravida  1   Para  1   Term  1   Preterm      AB      Living  1      SAB      IAB      Ectopic      Multiple      Live Births              Social History   Socioeconomic History   Marital status: Married    Spouse name: Not on file   Number of children: 1   Years of education: Not on file   Highest education level: Not on file  Occupational History   Not on file  Tobacco Use   Smoking status: Never   Smokeless tobacco: Never  Vaping Use   Vaping status: Never Used  Substance and Sexual Activity   Alcohol use: Never   Drug use: Never   Sexual activity: Yes    Birth control/protection: Post-menopausal  Other Topics Concern   Not on file  Social History Narrative   Not on file   Social Drivers of Health   Financial Resource Strain: Low Risk  (07/06/2023)   Overall Financial Resource Strain (CARDIA)    Difficulty of Paying Living Expenses: Not hard at all  Food Insecurity: No Food Insecurity (07/06/2023)   Hunger Vital Sign    Worried About Running Out of Food in the Last Year: Never true    Ran Out of Food in the Last Year: Never true  Transportation Needs: No Transportation Needs (07/06/2023)   PRAPARE - Administrator, Civil Service (Medical): No    Lack of Transportation (Non-Medical): No  Physical Activity:  Insufficiently Active (07/06/2023)   Exercise Vital Sign    Days of Exercise per Week: 2 days    Minutes of Exercise per Session: 20 min  Stress: No Stress Concern Present (07/06/2023)   [employer] of Occupational Health - Occupational Stress Questionnaire    Feeling of Stress : Only a little  Social Connections: Socially Isolated (07/06/2023)   Social Connection and Isolation Panel [NHANES]    Frequency of Communication with Friends and Family: Once a week    Frequency of Social Gatherings with Friends and Family: Once a week    Attends Religious Services: Never    Database administrator or Organizations: No    Attends Engineer, structural: Never    Marital Status: Married    Family History  Problem Relation Age of Onset   Cancer Mother        brain tumor   Arthritis Mother        degenative joint disease   Breast cancer Mother    Pancreatic cancer Father      Current Outpatient Medications:  alendronate (FOSAMAX) 70 MG tablet, Take 70 mg by mouth once a week. Take with a full glass of water on an empty stomach., Disp: , Rfl:    cyclobenzaprine (FLEXERIL) 5 MG tablet, Take 5 mg by mouth at bedtime., Disp: , Rfl:    FLUoxetine (PROZAC) 20 MG capsule, Take 30 mg by mouth daily., Disp: , Rfl:    meloxicam (MOBIC) 15 MG tablet, Take 15 mg by mouth daily., Disp: , Rfl:    rosuvastatin (CRESTOR) 10 MG tablet, Take 10 mg by mouth daily., Disp: , Rfl:    topiramate (TOPAMAX) 100 MG tablet, Take 200 mg by mouth at bedtime., Disp: , Rfl:    Conj Estrogens -Bazedoxifene 0.45-20 MG TABS, Take 1 tablet by mouth daily. Take 1 tablet daily (Patient not taking: Reported on 07/06/2023), Disp: 30 tablet, Rfl: 11  Review of Systems  Review of Systems  Constitutional: Negative for fever, chills, weight loss, malaise/fatigue and diaphoresis.  HENT: Negative for hearing loss, ear pain, nosebleeds, congestion, sore throat, neck pain, tinnitus and ear discharge.   Eyes: Negative for  blurred vision, double vision, photophobia, pain, discharge and redness.  Respiratory: Negative for cough, hemoptysis, sputum production, shortness of breath, wheezing and stridor.   Cardiovascular: Negative for chest pain, palpitations, orthopnea, claudication, leg swelling and PND.  Gastrointestinal: negative for abdominal pain. Negative for heartburn, nausea, vomiting, diarrhea, constipation, blood in stool and melena.  Genitourinary: Negative for dysuria, urgency, frequency, hematuria and flank pain.  Musculoskeletal: Negative for myalgias, back pain, joint pain and falls.  Skin: Negative for itching and rash.  Neurological: Negative for dizziness, tingling, tremors, sensory change, speech change, focal weakness, seizures, loss of consciousness, weakness and headaches.  Endo/Heme/Allergies: Negative for environmental allergies and polydipsia. Does not bruise/bleed easily.  Psychiatric/Behavioral: Negative for depression, suicidal ideas, hallucinations, memory loss and substance abuse. The patient is not nervous/anxious and does not have insomnia.        Objective:  Blood pressure 133/80, pulse 76, height 5\' 2"  (1.575 m), weight 128 lb (58.1 kg), last menstrual period 08/17/2014.   Physical Exam  Vitals reviewed. Constitutional: She is oriented to person, place, and time. She appears well-developed and well-nourished.  HENT:  Head: Normocephalic and atraumatic.        Right Ear: External ear normal.  Left Ear: External ear normal.  Nose: Nose normal.  Mouth/Throat: Oropharynx is clear and moist.  Eyes: Conjunctivae and EOM are normal. Pupils are equal, round, and reactive to light. Right eye exhibits no discharge. Left eye exhibits no discharge. No scleral icterus.  Neck: Normal range of motion. Neck supple. No tracheal deviation present. No thyromegaly present.  Cardiovascular: Normal rate, regular rhythm, normal heart sounds and intact distal pulses.  Exam reveals no gallop and no  friction rub.   No murmur heard. Respiratory: Effort normal and breath sounds normal. No respiratory distress. She has no wheezes. She has no rales. She exhibits no tenderness.  GI: Soft. Bowel sounds are normal. She exhibits no distension and no mass. There is no tenderness. There is no rebound and no guarding.  Genitourinary:  Breasts no masses skin changes or nipple changes bilaterally      Vulva is normal without lesions Vagina is pink moist without discharge Cervix normal in appearance and pap is not done Uterus is normal size shape and contour Adnexa is negative with normal sized ovaries   Musculoskeletal: Normal range of motion. She exhibits no edema and no tenderness.  Neurological: She is alert and oriented  to person, place, and time. She has normal reflexes. She displays normal reflexes. No cranial nerve deficit. She exhibits normal muscle tone. Coordination normal.  Skin: Skin is warm and dry. No rash noted. No erythema. No pallor.  Psychiatric: She has a normal mood and affect. Her behavior is normal. Judgment and thought content normal.       Medications Ordered at today's visit: No orders of the defined types were placed in this encounter.   Other orders placed at today's visit: No orders of the defined types were placed in this encounter.    ASSESSMENT + PLAN:    ICD-10-CM   1. Well woman exam with routine gynecological exam  Z01.419           Return if symptoms worsen or fail to improve, for yearly.
